# Patient Record
Sex: Female | Born: 1965 | Hispanic: No | Marital: Married | Smoking: Never smoker
Health system: Southern US, Community
[De-identification: ages and names within clinical notes are randomized; demographics above are authoritative.]

## PROBLEM LIST (undated history)

## (undated) DIAGNOSIS — E617 Deficiency of multiple nutrient elements: Secondary | ICD-10-CM

## (undated) DIAGNOSIS — R309 Painful micturition, unspecified: Secondary | ICD-10-CM

## (undated) DIAGNOSIS — R030 Elevated blood-pressure reading, without diagnosis of hypertension: Secondary | ICD-10-CM

## (undated) HISTORY — DX: Elevated blood-pressure reading, without diagnosis of hypertension: R03.0

## (undated) HISTORY — DX: Painful micturition, unspecified: R30.9

## (undated) HISTORY — DX: Deficiency of multiple nutrient elements: E61.7

---

## 2013-05-24 ENCOUNTER — Ambulatory Visit: Payer: Self-pay | Admitting: Internal Medicine

## 2013-06-05 ENCOUNTER — Ambulatory Visit: Payer: Self-pay | Admitting: Internal Medicine

## 2013-12-11 ENCOUNTER — Ambulatory Visit: Payer: Self-pay | Admitting: Internal Medicine

## 2014-06-04 ENCOUNTER — Ambulatory Visit: Payer: Self-pay | Admitting: Internal Medicine

## 2014-09-10 ENCOUNTER — Encounter: Payer: 59 | Admitting: Urgent Care

## 2014-09-10 NOTE — Progress Notes (Signed)
This encounter was created in error - please disregard.

## 2014-09-10 NOTE — Progress Notes (Signed)
Gastroenterology Pre-Procedure Review  Request Date: 07/30/14 Requesting Physician: Dr. Beverely Risen  PATIENT REVIEW QUESTIONS: The patient responded to the following health history questions as indicated:    1. Are you having any GI issues? no 2. Do you have a personal history of Polyps? no 3. Do you have a family history of Colon Cancer or Polyps? no 4. Diabetes Mellitus? no 5. Joint replacements in the past 12 months?no 6. Major health problems in the past 3 months?no 7. Any artificial heart valves, MVP, or defibrillator?no.Marland Kitchen    MEDICATIONS & ALLERGIES:    Patient reports the following regarding taking any anticoagulation/antiplatelet therapy:   Plavix, Coumadin, Eliquis, Xarelto, Lovenox, Pradaxa, Brilinta, or Effient? no Aspirin? no  Patient confirms/reports the following medications:  No current outpatient prescriptions on file.   No current facility-administered medications for this visit.    Patient confirms/reports the following allergies:  No Known Allergies  No orders of the defined types were placed in this encounter.    AUTHORIZATION INFORMATION Primary Insurance: 1D#: Group #:  Secondary Insurance: 1D#: Group #:  SCHEDULE INFORMATION: Date:  Time: Location:  [facility] services used for Colon Triage in office. [facility].  After triage completed, patient states that she prefers to have Dr. Bluford Kaufmann do this procedure. Explained that she would have to speak with Dr. Beverely Risen and have a new referral sent to Dr. Nigel Bridgeman office as we do not schedule his colonoscopies.

## 2014-09-10 NOTE — Progress Notes (Deleted)
Subjective:     Patient ID: Cassidy Sherman, female   DOB: [birth year], 49 y.o.   MRN: [id number]  HPI   Review of Systems     Objective:   Physical Exam     Assessment:     ***    Plan:     ***

## 2014-09-10 NOTE — Progress Notes (Deleted)
A user error has taken place: encounter opened in error, closed for administrative reasons.

## 2014-09-10 NOTE — Progress Notes (Deleted)
This encounter was created in error - please disregard. This encounter was created in error - please disregard. This encounter was created in error - please disregard. 

## 2014-11-26 ENCOUNTER — Encounter
Admission: RE | Disposition: A | Discharge status: Home / Self Care | Payer: Self-pay | Source: Ambulatory Visit | Attending: Gastroenterology

## 2014-11-26 ENCOUNTER — Ambulatory Visit: Payer: 59 | Admitting: Anesthesiology

## 2014-11-26 ENCOUNTER — Ambulatory Visit
Admission: RE | Admit: 2014-11-26 | Discharge: 2014-11-26 | Disposition: A | Discharge status: Home / Self Care | Payer: 59 | Source: Ambulatory Visit | Attending: Gastroenterology | Admitting: Gastroenterology

## 2014-11-26 ENCOUNTER — Encounter: Payer: Self-pay | Admitting: *Deleted

## 2014-11-26 DIAGNOSIS — E638 Other specified nutritional deficiencies: Secondary | ICD-10-CM | POA: Insufficient documentation

## 2014-11-26 DIAGNOSIS — Z1211 Encounter for screening for malignant neoplasm of colon: Secondary | ICD-10-CM | POA: Diagnosis not present

## 2014-11-26 DIAGNOSIS — R309 Painful micturition, unspecified: Secondary | ICD-10-CM | POA: Diagnosis not present

## 2014-11-26 HISTORY — PX: COLONOSCOPY WITH PROPOFOL: SHX5780

## 2014-11-26 SURGERY — COLONOSCOPY WITH PROPOFOL
Anesthesia: General

## 2014-11-26 MED ORDER — SODIUM CHLORIDE 0.9 % IV SOLN
INTRAVENOUS | Status: DC
  Administered 2014-11-26: 11:00:00 via INTRAVENOUS

## 2014-11-26 MED ORDER — MIDAZOLAM HCL 2 MG/2ML IJ SOLN
INTRAMUSCULAR | Status: DC | PRN
  Administered 2014-11-26: 1 mg via INTRAVENOUS

## 2014-11-26 MED ORDER — SODIUM CHLORIDE 0.9 % IV SOLN
INTRAVENOUS | Status: DC
  Administered 2014-11-26: 1000 mL via INTRAVENOUS

## 2014-11-26 MED ORDER — PROPOFOL 10 MG/ML IV BOLUS
INTRAVENOUS | Status: DC | PRN
  Administered 2014-11-26 (×5): 10 mg via INTRAVENOUS
  Administered 2014-11-26: 20 mg via INTRAVENOUS
  Administered 2014-11-26 (×2): 10 mg via INTRAVENOUS

## 2014-11-26 NOTE — Anesthesia Postprocedure Evaluation (Signed)
  Anesthesia Post-op Note  Patient: Cassidy Sherman  Procedure(s) Performed: Procedure(s): COLONOSCOPY WITH PROPOFOL (N/A)  Anesthesia type:General  Patient location: PACU  Post pain: Pain level controlled  Post assessment: Post-op Vital signs reviewed, Patient's Cardiovascular Status Stable, Respiratory Function Stable, Patent Airway and No signs of Nausea or vomiting  Post vital signs: Reviewed and stable  Last Vitals:  Filed Vitals:   11/26/14 0955  BP: 141/87  Pulse: 63  Temp: 36.5 C  Resp: 18    Level of consciousness: awake, alert  and patient cooperative  Complications: No apparent anesthesia complications

## 2014-11-26 NOTE — Transfer of Care (Signed)
Immediate Anesthesia Transfer of Care Note  Patient: Cassidy Sherman  Procedure(s) Performed: Procedure(s): COLONOSCOPY WITH PROPOFOL (N/A)  Patient Location: PACU and Endoscopy Unit  Anesthesia Type:General  Level of Consciousness: alert   Airway & Oxygen Therapy: Patient Spontanous Breathing and Patient connected to nasal cannula oxygen  Post-op Assessment: Report given to RN and Post -op Vital signs reviewed and stable  Post vital signs: stable  Last Vitals:  Filed Vitals:   11/26/14 0955  BP: 141/87  Pulse: 63  Temp: 36.5 C  Resp: 18    Complications: No apparent anesthesia complications

## 2014-11-26 NOTE — H&P (Signed)
    Primary Care Physician:  Lyndon Code, MD Primary Gastroenterologist:  Dr. Bluford Kaufmann  Pre-Procedure History & Physical: HPI:  Cassidy Sherman is a 49 y.o. female is here for an colonoscopy.   Past Medical History  Diagnosis Date  . Pain on micturition   . Deficiency of multiple nutrient elements   . Elevated blood pressure reading without diagnosis of hypertension     Past Surgical History  Procedure Laterality Date  . Cesarean section  1995 1999    Prior to Admission medications   Medication Sig Start Date End Date Taking? Authorizing Provider  Multiple Vitamin (MULTIVITAMIN) tablet Take 1 tablet by mouth daily.   Yes Historical Provider, MD    Allergies as of 10/30/2014  . (No Known Allergies)    Family History  Problem Relation Age of Onset  . Diabetes Mother   . Diabetes Father   . Hypertension Father   . Colon cancer Neg Hx     Social History   Social History  . Marital Status: Married    Spouse Name: N/A  . Number of Children: N/A  . Years of Education: N/A   Occupational History  . Not on file.   Social History Main Topics  . Smoking status: Never Smoker   . Smokeless tobacco: Never Used  . Alcohol Use: No  . Drug Use: No  . Sexual Activity: Not on file   Other Topics Concern  . Not on file   Social History Narrative    Review of Systems: See HPI, otherwise negative ROS  Physical Exam: BP 141/87 mmHg  Pulse 63  Temp(Src) 97.7 F (36.5 C) (Oral)  Resp 18  Ht  (1.499 m)  Wt 56.246 kg (124 lb)  BMI 25.03 kg/m2  SpO2 100% General:   Alert,  pleasant and cooperative in NAD Head:  Normocephalic and atraumatic. Neck:  Supple; no masses or thyromegaly. Lungs:  Clear throughout to auscultation.    Heart:  Regular rate and rhythm. Abdomen:  Soft, nontender and nondistended. Normal bowel sounds, without guarding, and without rebound.   Neurologic:  Alert and  oriented x4;  grossly normal neurologically.  Impression/Plan: Cassidy Sherman is  here for an colonoscopy to be performed for screening.  Risks, benefits, limitations, and alternatives regarding  colonoscopy have been reviewed with the patient.  Questions have been answered.  All parties agreeable.   Kambre Messner, Ezzard Standing, MD  11/26/2014, 10:07 AM

## 2014-11-26 NOTE — Op Note (Signed)
[facility] Patient Name: Cassidy Sherman Procedure Date: 11/26/2014 10:34 AM MRN: [id number] Account #: [id number] Date of Birth: Sep 03, 1965 Admit Type: Outpatient Age: 49 Room: [facility] ENDO ROOM 4 Gender: Female Note Status: Finalized Procedure:         Colonoscopy Indications:       Screening for colorectal malignant neoplasm Providers:         Ezzard Standing. Bluford Kaufmann, MD Referring MD:      Lyndon Code, MD (Referring MD) Medicines:         Monitored Anesthesia Care Complications:     No immediate complications. Procedure:         Pre-Anesthesia Assessment:                    - Prior to the procedure, a History and Physical was                     performed, and patient medications, allergies and                     sensitivities were reviewed. The patient's tolerance of                     previous anesthesia was reviewed.                    - The risks and benefits of the procedure and the sedation                     options and risks were discussed with the patient. All                     questions were answered and informed consent was obtained.                    - After reviewing the risks and benefits, the patient was                     deemed in satisfactory condition to undergo the procedure.                    After obtaining informed consent, the colonoscope was                     passed under direct vision. Throughout the procedure, the                     patient's blood pressure, pulse, and oxygen saturations                     were monitored continuously. The Olympus PCF-H180AL                     colonoscope ( S#: [id number] ) was introduced through the                     anus and advanced to the the cecum, identified by                     appendiceal orifice and ileocecal valve. The colonoscopy                     was performed without difficulty. The patient tolerated  the procedure well. The quality of the bowel  preparation                     was good. Findings:      The colon (entire examined portion) appeared normal. Impression:        - The entire examined colon is normal.                    - No specimens collected. Recommendation:    - Discharge patient to home.                    - Repeat colonoscopy in 10 years for surveillance.                    - The findings and recommendations were discussed with the                     patient. Procedure Code(s): --- Professional ---                    [phone], Colonoscopy, flexible; diagnostic, including                     collection of specimen(s) by brushing or washing, when                     performed (separate procedure) Diagnosis Code(s): --- Professional ---                    Z12.11, Encounter for screening for malignant neoplasm of                     colon CPT copyright 2014 [employer]. All rights reserved. The codes documented in this report are preliminary and upon coder review may  be revised to meet current compliance requirements. Wallace Cullens, MD 11/26/2014 10:58:43 AM This report has been signed electronically. Number of Addenda: 0 Note Initiated On: 11/26/2014 10:34 AM Scope Withdrawal Time: 0 hours 4 minutes 0 seconds  Total Procedure Duration: 0 hours 11 minutes 22 seconds       [facility]

## 2014-11-26 NOTE — Anesthesia Preprocedure Evaluation (Signed)
Anesthesia Evaluation  Patient identified by MRN, date of birth, ID band Patient awake    Reviewed: Allergy & Precautions, H&P , NPO status , Patient's Chart, lab work & pertinent test results, reviewed documented beta blocker date and time   Airway Mallampati: II  TM Distance: >3 FB Neck ROM: full    Dental no notable dental hx. (+) Teeth Intact   Pulmonary neg pulmonary ROS,  breath sounds clear to auscultation  Pulmonary exam normal       Cardiovascular Exercise Tolerance: Good negative cardio ROS  Rhythm:regular Rate:Normal     Neuro/Psych negative neurological ROS  negative psych ROS   GI/Hepatic negative GI ROS, Neg liver ROS,   Endo/Other  negative endocrine ROS  Renal/GU negative Renal ROS  negative genitourinary   Musculoskeletal   Abdominal   Peds  Hematology negative hematology ROS (+)   Anesthesia Other Findings   Reproductive/Obstetrics negative OB ROS                             Anesthesia Physical Anesthesia Plan  ASA: II  Anesthesia Plan: General   Post-op Pain Management:    Induction:   Airway Management Planned:   Additional Equipment:   Intra-op Plan:   Post-operative Plan:   Informed Consent: I have reviewed the patients History and Physical, chart, labs and discussed the procedure including the risks, benefits and alternatives for the proposed anesthesia with the patient or authorized representative who has indicated his/her understanding and acceptance.   Dental Advisory Given  Plan Discussed with: CRNA  Anesthesia Plan Comments:         Anesthesia Quick Evaluation  

## 2014-11-27 ENCOUNTER — Encounter: Payer: Self-pay | Admitting: Gastroenterology

## 2015-07-30 ENCOUNTER — Other Ambulatory Visit: Payer: Self-pay | Admitting: Nurse Practitioner

## 2015-07-30 DIAGNOSIS — Z1231 Encounter for screening mammogram for malignant neoplasm of breast: Secondary | ICD-10-CM

## 2015-08-19 ENCOUNTER — Ambulatory Visit
Admission: RE | Admit: 2015-08-19 | Discharge: 2015-08-19 | Disposition: A | Discharge status: Home / Self Care | Payer: 59 | Source: Ambulatory Visit | Attending: Nurse Practitioner | Admitting: Nurse Practitioner

## 2015-08-19 DIAGNOSIS — Z1231 Encounter for screening mammogram for malignant neoplasm of breast: Secondary | ICD-10-CM

## 2016-06-15 ENCOUNTER — Other Ambulatory Visit: Payer: Self-pay | Admitting: Internal Medicine

## 2016-06-15 DIAGNOSIS — Z1231 Encounter for screening mammogram for malignant neoplasm of breast: Secondary | ICD-10-CM

## 2016-08-31 ENCOUNTER — Ambulatory Visit: Payer: 59

## 2016-09-14 ENCOUNTER — Ambulatory Visit
Admission: RE | Admit: 2016-09-14 | Discharge: 2016-09-14 | Disposition: A | Discharge status: Home / Self Care | Payer: 59 | Source: Ambulatory Visit | Attending: Internal Medicine | Admitting: Internal Medicine

## 2016-09-14 DIAGNOSIS — Z1231 Encounter for screening mammogram for malignant neoplasm of breast: Secondary | ICD-10-CM | POA: Insufficient documentation

## 2017-07-05 ENCOUNTER — Other Ambulatory Visit: Payer: Self-pay | Admitting: Internal Medicine

## 2017-07-05 DIAGNOSIS — Z1231 Encounter for screening mammogram for malignant neoplasm of breast: Secondary | ICD-10-CM

## 2017-09-16 ENCOUNTER — Ambulatory Visit
Admission: RE | Admit: 2017-09-16 | Discharge: 2017-09-16 | Disposition: A | Discharge status: Home / Self Care | Payer: Managed Care, Other (non HMO) | Source: Ambulatory Visit | Attending: Internal Medicine | Admitting: Internal Medicine

## 2017-09-16 DIAGNOSIS — Z1231 Encounter for screening mammogram for malignant neoplasm of breast: Secondary | ICD-10-CM | POA: Diagnosis not present

## 2019-06-20 ENCOUNTER — Other Ambulatory Visit: Payer: Self-pay | Admitting: Certified Nurse Midwife

## 2019-06-20 DIAGNOSIS — Z1231 Encounter for screening mammogram for malignant neoplasm of breast: Secondary | ICD-10-CM

## 2019-07-03 ENCOUNTER — Ambulatory Visit
Admission: RE | Admit: 2019-07-03 | Discharge: 2019-07-03 | Disposition: A | Discharge status: Home / Self Care | Payer: Managed Care, Other (non HMO) | Source: Ambulatory Visit | Attending: Certified Nurse Midwife | Admitting: Certified Nurse Midwife

## 2019-07-03 DIAGNOSIS — Z1231 Encounter for screening mammogram for malignant neoplasm of breast: Secondary | ICD-10-CM | POA: Diagnosis not present

## 2020-10-16 ENCOUNTER — Other Ambulatory Visit: Payer: Self-pay | Admitting: Certified Nurse Midwife

## 2020-10-16 DIAGNOSIS — Z1231 Encounter for screening mammogram for malignant neoplasm of breast: Secondary | ICD-10-CM

## 2020-10-29 ENCOUNTER — Other Ambulatory Visit: Payer: Self-pay

## 2020-10-29 ENCOUNTER — Ambulatory Visit
Admission: RE | Admit: 2020-10-29 | Discharge: 2020-10-29 | Disposition: A | Discharge status: Home / Self Care | Payer: Managed Care, Other (non HMO) | Source: Ambulatory Visit | Attending: Certified Nurse Midwife | Admitting: Certified Nurse Midwife

## 2020-10-29 DIAGNOSIS — Z1231 Encounter for screening mammogram for malignant neoplasm of breast: Secondary | ICD-10-CM | POA: Insufficient documentation

## 2021-10-23 ENCOUNTER — Other Ambulatory Visit: Payer: Self-pay | Admitting: Certified Nurse Midwife

## 2021-10-23 ENCOUNTER — Other Ambulatory Visit: Payer: Self-pay | Admitting: Internal Medicine

## 2021-10-23 DIAGNOSIS — Z1231 Encounter for screening mammogram for malignant neoplasm of breast: Secondary | ICD-10-CM

## 2021-11-18 ENCOUNTER — Ambulatory Visit
Admission: RE | Admit: 2021-11-18 | Discharge: 2021-11-18 | Disposition: A | Discharge status: Home / Self Care | Payer: Managed Care, Other (non HMO) | Source: Ambulatory Visit | Attending: Internal Medicine | Admitting: Internal Medicine

## 2021-11-18 DIAGNOSIS — Z1231 Encounter for screening mammogram for malignant neoplasm of breast: Secondary | ICD-10-CM | POA: Insufficient documentation

## 2022-06-19 IMAGING — MG MM DIGITAL SCREENING BILAT W/ TOMO AND CAD
8 series · 8 of 24 positions shown · non-contrast
Comparison: Previous exam(s).

CLINICAL DATA: Screening.

EXAM:
DIGITAL SCREENING BILATERAL MAMMOGRAM WITH TOMOSYNTHESIS AND CAD
TECHNIQUE: Bilateral screening digital craniocaudal and mediolateral oblique
mammograms were obtained. Bilateral screening digital breast
tomosynthesis was performed. The images were evaluated with
computer-aided detection.

[R MLO synth-2D]
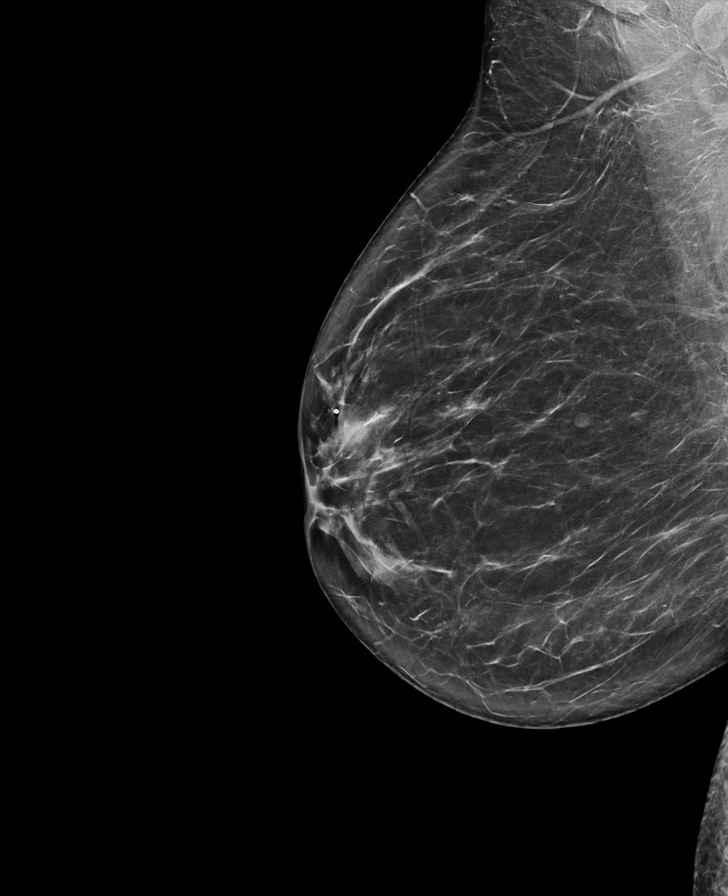

[R CC synth-2D]
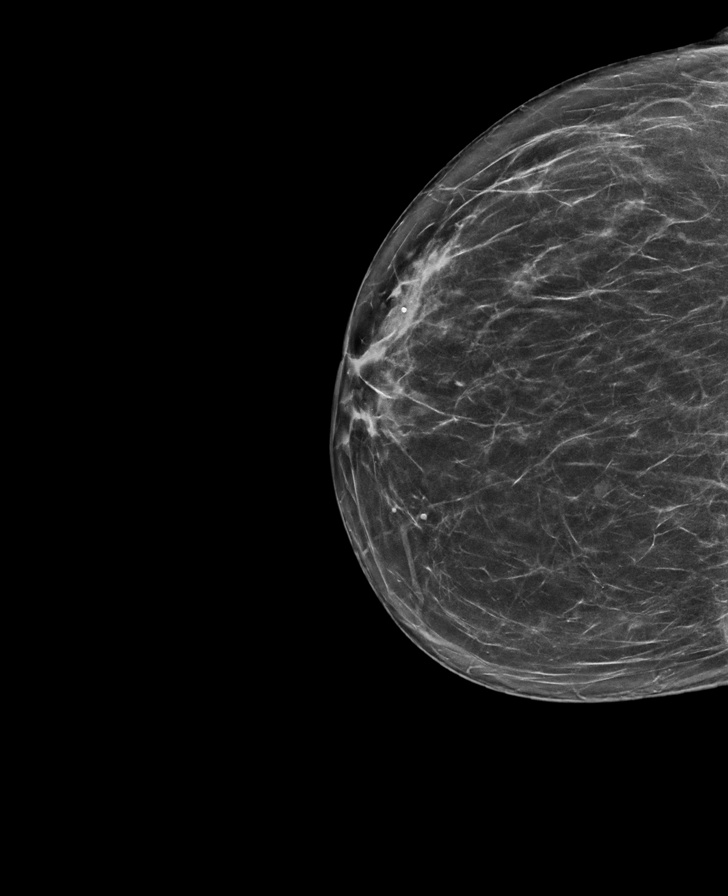

[L MLO synth-2D]
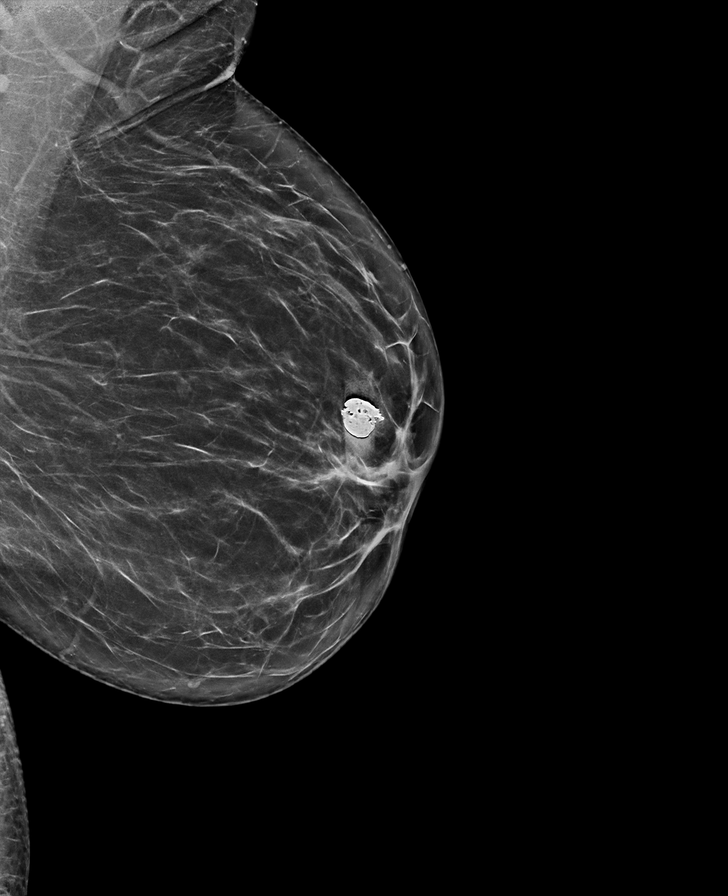

[L CC synth-2D]
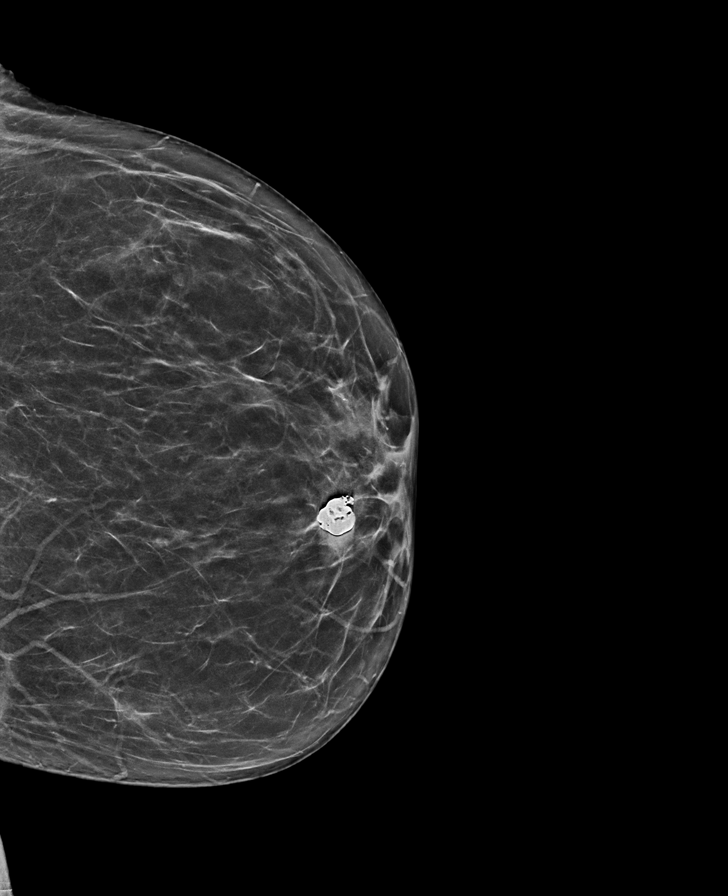

[R MLO tomo · tomo slice 39/77.0]
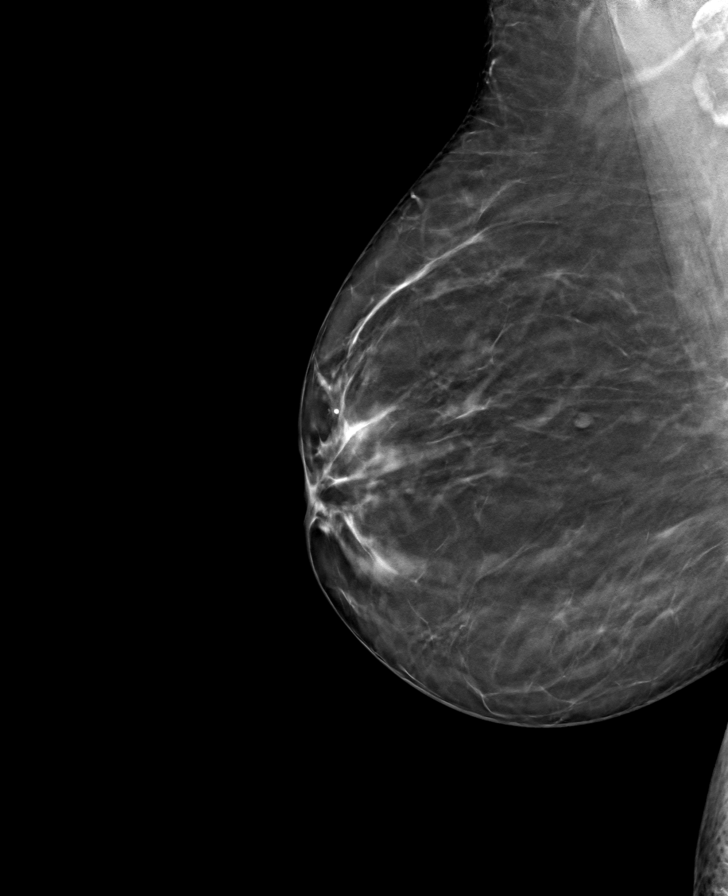

[R CC tomo · tomo slice 35/68.0]
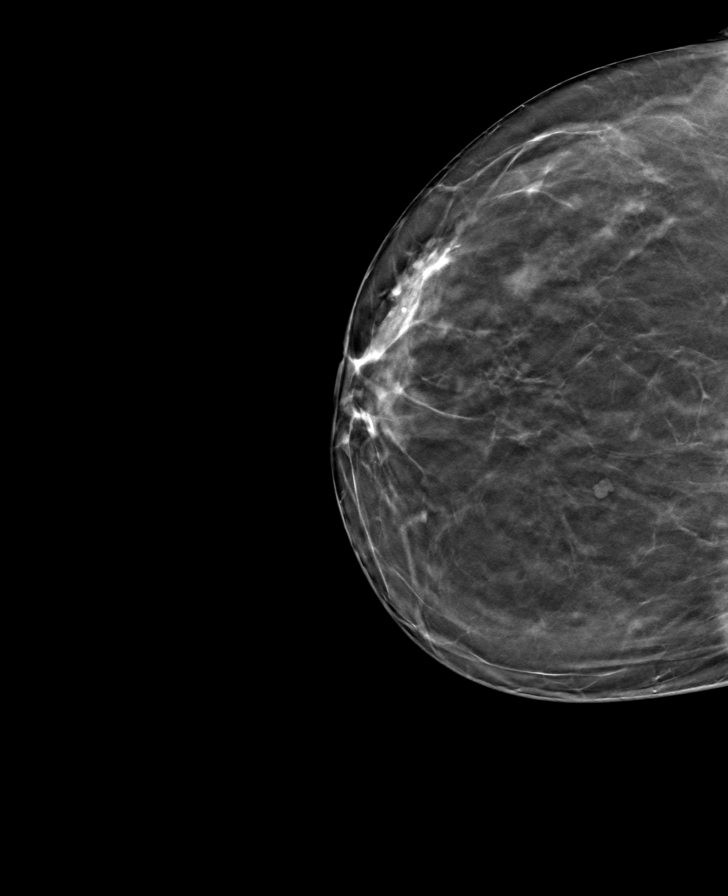

[L CC tomo · tomo slice 34/67.0]
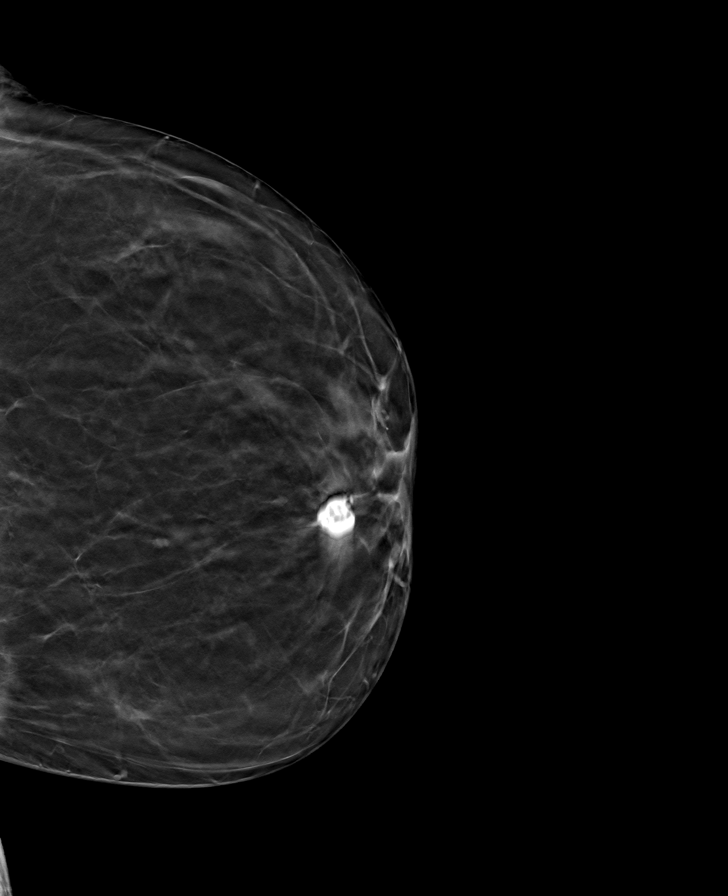

[L MLO tomo · tomo slice 41/80.0]
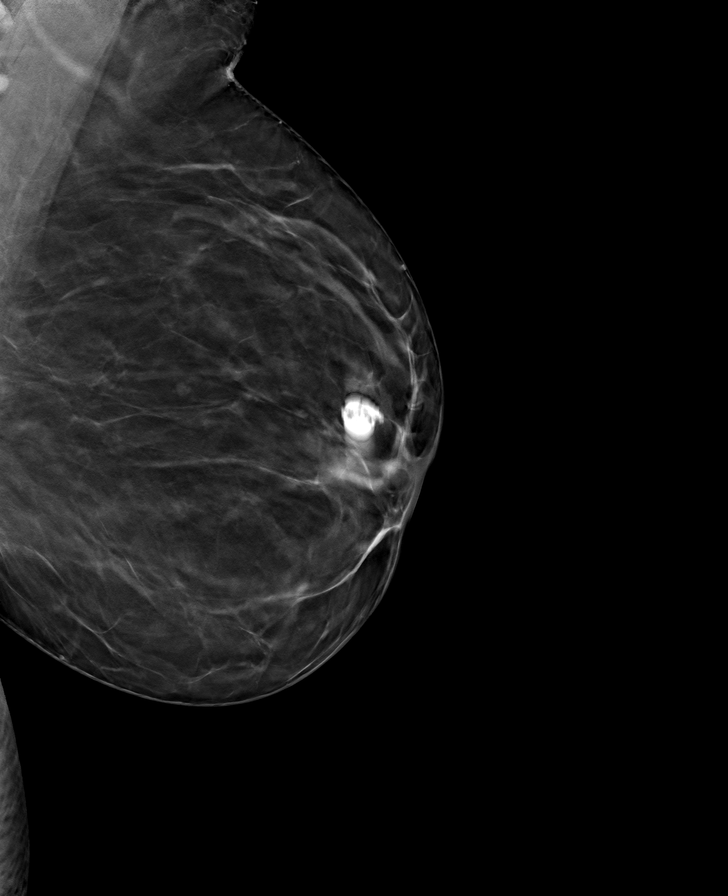

[8 of 24 positions shown; findings below may reference images not displayed]

ACR Breast Density Category b: There are scattered areas of
fibroglandular density.
FINDINGS: There are no findings suspicious for malignancy.
IMPRESSION: No mammographic evidence of malignancy. A result letter of this
screening mammogram will be mailed directly to the patient.

RECOMMENDATION:
Screening mammogram in one year. (Code:51-O-LD2)

BI-RADS CATEGORY  1: Negative.

## 2022-12-17 ENCOUNTER — Other Ambulatory Visit: Payer: Self-pay | Admitting: Certified Nurse Midwife

## 2022-12-17 DIAGNOSIS — Z1231 Encounter for screening mammogram for malignant neoplasm of breast: Secondary | ICD-10-CM

## 2023-01-26 ENCOUNTER — Ambulatory Visit
Admission: RE | Admit: 2023-01-26 | Discharge: 2023-01-26 | Disposition: A | Discharge status: Home / Self Care | Payer: Managed Care, Other (non HMO) | Source: Ambulatory Visit | Attending: Certified Nurse Midwife | Admitting: Certified Nurse Midwife

## 2023-01-26 DIAGNOSIS — Z1231 Encounter for screening mammogram for malignant neoplasm of breast: Secondary | ICD-10-CM | POA: Insufficient documentation

## 2023-12-21 ENCOUNTER — Other Ambulatory Visit: Payer: Self-pay | Admitting: Certified Nurse Midwife

## 2023-12-21 DIAGNOSIS — Z1231 Encounter for screening mammogram for malignant neoplasm of breast: Secondary | ICD-10-CM

## 2024-01-27 ENCOUNTER — Encounter

## 2024-01-31 ENCOUNTER — Ambulatory Visit
Admission: RE | Admit: 2024-01-31 | Discharge: 2024-01-31 | Disposition: A | Discharge status: Home / Self Care | Source: Ambulatory Visit | Attending: Certified Nurse Midwife | Admitting: Certified Nurse Midwife

## 2024-01-31 DIAGNOSIS — Z1231 Encounter for screening mammogram for malignant neoplasm of breast: Secondary | ICD-10-CM | POA: Diagnosis present
